# Patient Record
Sex: Female | Born: 2000
Health system: Southern US, Community
[De-identification: ages and names within clinical notes are randomized; demographics above are authoritative.]

## PROBLEM LIST (undated history)

## (undated) DIAGNOSIS — F419 Anxiety disorder, unspecified: Secondary | ICD-10-CM

## (undated) HISTORY — PX: ESOPHAGOGASTRODUODENOSCOPY ENDOSCOPY: SHX5814

## (undated) HISTORY — PX: WISDOM TOOTH EXTRACTION: SHX21

## (undated) HISTORY — DX: Anxiety disorder, unspecified: F41.9

---
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
## 2019-01-06 ENCOUNTER — Ambulatory Visit (INDEPENDENT_AMBULATORY_CARE_PROVIDER_SITE_OTHER): Payer: 59 | Admitting: Psychiatry

## 2019-01-06 ENCOUNTER — Encounter: Payer: Self-pay | Admitting: Psychiatry

## 2019-01-06 ENCOUNTER — Other Ambulatory Visit: Payer: Self-pay

## 2019-01-06 ENCOUNTER — Encounter (INDEPENDENT_AMBULATORY_CARE_PROVIDER_SITE_OTHER): Payer: Self-pay

## 2019-01-06 VITALS — Ht 61.0 in | Wt 113.0 lb

## 2019-01-06 DIAGNOSIS — F410 Panic disorder [episodic paroxysmal anxiety] without agoraphobia: Secondary | ICD-10-CM

## 2019-01-06 DIAGNOSIS — F411 Generalized anxiety disorder: Secondary | ICD-10-CM

## 2019-01-06 MED ORDER — PAROXETINE HCL 10 MG PO TABS: 15.0000 mg | ORAL_TABLET | Freq: Every day | ORAL | 2 refills | Status: DC

## 2019-01-06 NOTE — Progress Notes (Signed)
[facility]  Patient ID: Jenna Macdonald,  MRN: [id number]  PCP: Harrie Jeans, MD  Date of Evaluation: 01/06/2019 Time spent:15 minutes from 1410 to 1425  Chief Complaint:  Chief Complaint    Anxiety; Panic Attack      HISTORY/CURRENT STATUS: Jenna Macdonald is seen onsite in office face-to-face conjointly with father with consent with epic collateral for psychiatric interview and exam in 23-month evaluation and management of generalized and panic anxiety.  Patient and father are doubtful of need for appointments even though they deferred starting Paxil for 3 years patient now stating that treatment has changed her life for the better.  She rarely needs Xanax 0.5 mg once every month or 2.  They required a 17-month refill by phone 11/29/2018 for the Paxil as if doubting the need for appointment or having anxiety specifically for the medical office.  She is no longer taking Prilosec but does take Accutane and OCP.  Father states the patient is thriving and communicates well now, though they do not acknowledge their resistance to treatment in the past or the consequences of such.  However they can look constructively at the future agreeing to yearly follow-up for the Paxil 15 mg every evening after supper.  They closed therapy with Dr. Mikey Bussing.  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  She has no suicidality, hypomania, psychosis, or delirium.   Anxiety        Presents for follow-up visit. Symptoms include decreased concentration, dizziness, excessive worry, feeling of choking, insomnia, nausea, nervous/anxious behavior, panic and restlessness. Patient reports no confusion, no chest pain, no depressed mood, no dizziness, no dry mouth, no palpitations, and no suicidal ideas. Symptoms occur every few days being severe only every month or two. The severity of symptoms is mild to moderate overall. The quality of sleep is good. Nighttime awakenings: Occasional.  Compliance with  medications is 76-100%. Side effects of treatment include GI discomfort.   Individual Medical History/ Review of Systems: Changes? :Yes as 1 month after last session, mother required psychiatric certification to dermatology that patient is a good candidate according to mental health for Accutane, now on her fifth fill with face clear continuing treatment.  She also continues her birth control pill.  Allergies: Patient has no known allergies.  Current Medications:  Current Outpatient Medications:  .  ALPRAZolam (XANAX) 0.5 MG tablet, Take 1 tablet (0.5 mg total) by mouth 2 (two) times daily as needed for anxiety (Panic)., Disp: 60 tablet, Rfl: 1 .  ampicillin (PRINCIPEN) 500 MG capsule, TAKE 1 CAPSULE WITH FOOD ONCE A DAY ORALLY 30 DAYS, Disp: , Rfl:  .  hyoscyamine (LEVSIN SL) 0.125 MG SL tablet, DISSOLVE 1 TABLET ON THE TONGUE 30 MIN BEFORE MEALS, Disp: 30 tablet, Rfl: 0 .  norethindrone-ethinyl estradiol (MICROGESTIN,JUNEL,LOESTRIN) 1-20 MG-MCG tablet, Take 1 tablet by mouth daily., Disp: 3 Package, Rfl: 4 .  omeprazole (PRILOSEC) 40 MG capsule, Take 1 capsule (40 mg total) by mouth daily. Take 40 mg daily before breakfast for 8-10 weeks., Disp: 90 capsule, Rfl: 0 .  PARoxetine (PAXIL) 10 MG tablet, Take 1.5 tablets (15 mg total) by mouth daily., Disp: 135 tablet, Rfl: 2   Medication Side Effects: none  Family Medical/ Social History: Changes? No  MENTAL HEALTH EXAM:  Height 5\' 1"  (1.549 m), weight 113 lb (51.3 kg).Body mass index is 21.35 kg/m.  Others deferred for coronavirus pandemic debate over essentials  General Appearance: Casual, Fairly Groomed and Meticulous  Eye Contact:  Good  Speech:  Clear and Coherent, Normal Rate and Talkative  Volume:  Normal  Mood:  Anxious and Euthymic  Affect:  Inappropriate and Anxious  Thought Process:  Coherent, Goal Directed and Irrelevant  Orientation:  Full (Time, Place, and Person)  Thought Content: Obsessions and Rumination   Suicidal  Thoughts:  No  Homicidal Thoughts:  No  Memory:  Immediate;   Good Remote;   Good  Judgement:  Fair  Insight:  Fair  Psychomotor Activity:  Normal and Mannerisms  Concentration:  Concentration: Good and Attention Span: Good  Recall:  Good  Fund of Knowledge: Good  Language: Good  Assets:  Desire for Improvement Leisure Time Vocational/Educational  ADL's:  Intact  Cognition: WNL  Prognosis:  Good    DIAGNOSES:    ICD-10-CM   1. Generalized anxiety disorder  F41.1 PARoxetine (PAXIL) 10 MG tablet  2. Panic disorder  F41.0 PARoxetine (PAXIL) 10 MG tablet    Receiving Psychotherapy: No    RECOMMENDATIONS: Over 50% of the time is spent in counseling and coordination of care attempting therapeutic understanding and alliance for access to treatment even as they decline return for treatment other than yearly and have closed all other treatments other than birth control pill and Accutane.  Patient cannot with father present explore family components of her anxiety, though she is making excellent progress in applying her skills to her daily life now to be at [employer].  She is E scribed Paxil 10 mg tablet taking 1-1/2 tablets after supper sent as #135 [facility] [address] having sent a 275-month supply there with no refill on 11/29/2018 also.  She will return in early next [place] for follow-up and understands the excellent therapy available at [facility] if willing.   Chauncey MannGlenn E Eilidh Marcano, MD

## 2019-10-05 ENCOUNTER — Other Ambulatory Visit: Payer: Self-pay

## 2019-10-05 DIAGNOSIS — F411 Generalized anxiety disorder: Secondary | ICD-10-CM

## 2019-10-05 DIAGNOSIS — F410 Panic disorder [episodic paroxysmal anxiety] without agoraphobia: Secondary | ICD-10-CM

## 2019-10-05 MED ORDER — PAROXETINE HCL 10 MG PO TABS: 15.0000 mg | ORAL_TABLET | Freq: Every day | ORAL | 0 refills | Status: DC

## 2019-12-15 ENCOUNTER — Ambulatory Visit (INDEPENDENT_AMBULATORY_CARE_PROVIDER_SITE_OTHER): Payer: 59 | Admitting: Psychiatry

## 2019-12-15 ENCOUNTER — Other Ambulatory Visit: Payer: Self-pay

## 2019-12-15 ENCOUNTER — Encounter: Payer: Self-pay | Admitting: Psychiatry

## 2019-12-15 VITALS — Ht 61.0 in | Wt 120.0 lb

## 2019-12-15 DIAGNOSIS — F410 Panic disorder [episodic paroxysmal anxiety] without agoraphobia: Secondary | ICD-10-CM

## 2019-12-15 DIAGNOSIS — F411 Generalized anxiety disorder: Secondary | ICD-10-CM

## 2019-12-15 MED ORDER — PAROXETINE HCL 10 MG PO TABS: 15.0000 mg | ORAL_TABLET | Freq: Every day | ORAL | 3 refills | Status: DC

## 2019-12-15 NOTE — Progress Notes (Signed)
[facility]  Patient ID: Jenna Macdonald,  MRN: [id number]  PCP: Chales Salmon, MD  Date of Evaluation: 12/15/2019 Time spent:15 minutes from 1400 to 1415  Chief Complaint:   HISTORY/CURRENT STATUS: Jenna Macdonald is seen onsite in office 15 minutes face-to-face conjointly with father with consent with epic collateral for psychiatric interview and exam in 85-month evaluation and management of generalized and panic anxiety disorders.  In the last year, the patient has continued her Paxil 15 mg nightly for which she had Prilosec that has been discontinued.  She rarely needs Xanax 0.5 mg twice daily for panic as needed with [place] registry documenting last dispensing 07/08/2018.  She is off Accutane since October 2020 but does continue her birth control pill.  Patient concludes she was been anxious since she was a baby but is doing better in the last year overall with anxiety.  She does have a therapist in [place] not part of the [facility] at [facility] in [place].  Pharmacy is at [facility] close to her apartment so that she has security and consistency complying with treatment.  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  She has no substance use, suicidality, psychosis, delirium, or mania.   Individual Medical History/ Review of Systems: Changes? :Yes She has gained 7 pounds in the last 11 months continuing birth control pill in half Accutane and Prilosec.  Allergies: Patient has no known allergies.  Current Medications:  Current Outpatient Medications:  .  ALPRAZolam (XANAX) 0.5 MG tablet, Take 1 tablet (0.5 mg total) by mouth 2 (two) times daily as needed for anxiety (Panic)., Disp: 60 tablet, Rfl: 1 .  ampicillin (PRINCIPEN) 500 MG capsule, TAKE 1 CAPSULE WITH FOOD ONCE A DAY ORALLY 30 DAYS, Disp: , Rfl:  .  hyoscyamine (LEVSIN SL) 0.125 MG SL tablet, DISSOLVE 1 TABLET ON THE TONGUE 30 MIN BEFORE MEALS,  Disp: 30 tablet, Rfl: 0 .  norethindrone-ethinyl estradiol (MICROGESTIN,JUNEL,LOESTRIN) 1-20 MG-MCG tablet, Take 1 tablet by mouth daily., Disp: 3 Package, Rfl: 4 .  omeprazole (PRILOSEC) 40 MG capsule, Take 1 capsule (40 mg total) by mouth daily. Take 40 mg daily before breakfast for 8-10 weeks., Disp: 90 capsule, Rfl: 0 .  PARoxetine (PAXIL) 10 MG tablet, Take 1.5 tablets (15 mg total) by mouth at bedtime., Disp: 135 tablet, Rfl: 3 Medication Side Effects: none  Family Medical/ Social History: Changes? Yes father does not approve of daily medication but has recently started a statin for his cholesterol, and he can thereby be more accepting of the patient's Paxil.  MENTAL HEALTH EXAM:  Height 5\' 1"  (1.549 m), weight 120 lb (54.4 kg).Body mass index is 22.67 kg/m. Muscle strengths and tone 5/5, postural reflexes and gait 0/0, and AIMS = 0.  General Appearance: Casual, Fairly Groomed and Meticulous  Eye Contact:  Fair  Speech:  Clear and Coherent, Normal Rate and Talkative  Volume:  Normal  Mood:  Anxious and Euphoric  Affect:  Congruent and Anxious  Thought Process:  Coherent, Goal Directed and Descriptions of Associations: Intact  Orientation:  Full (Time, Place, and Person)  Thought Content: Rumination   Suicidal Thoughts:  No  Homicidal Thoughts:  No  Memory:  Immediate;   Good Remote;   Good  Judgement:  Good  Insight:  Fair  Psychomotor Activity:  Normal and Mannerisms  Concentration:  Concentration: Good and Attention Span: Good  Recall:  Good  Fund of  Knowledge: Good  Language: Good  Assets:  Desire for Improvement Intimacy Resilience Talents/Skills  ADL's:  Intact  Cognition: WNL  Prognosis:  Good    DIAGNOSES:    ICD-10-CM   1. Generalized anxiety disorder  F41.1 PARoxetine (PAXIL) 10 MG tablet  2. Panic disorder  F41.0 PARoxetine (PAXIL) 10 MG tablet    Receiving Psychotherapy: Yes  [facility] in [place].    RECOMMENDATIONS: The  patient has adequate supply of Xanax still from last dispensing 07/08/2018 and 0.5 mg twice daily as needed for panic.  She is E scribed Paxil 10 mg tablet taking 1.5 tablets total 15 mg every bedtime sent as #135 with [facility] [place] for generalized and panic anxiety.  Prevention and monitoring safety hygiene are updated and integrated with CBT underway.  She will return for follow-up in 1 year or sooner if needed, mother inquiring about determining duration of treatment and patient may wish to occasion after college complete in 2 years.   Jenna Mann, MD

## 2019-12-16 ENCOUNTER — Encounter: Payer: Self-pay | Admitting: Psychiatry

## 2020-03-14 ENCOUNTER — Encounter: Payer: Self-pay | Admitting: Psychiatry

## 2020-03-15 ENCOUNTER — Other Ambulatory Visit: Payer: Self-pay

## 2020-03-15 DIAGNOSIS — F411 Generalized anxiety disorder: Secondary | ICD-10-CM

## 2020-03-15 DIAGNOSIS — F410 Panic disorder [episodic paroxysmal anxiety] without agoraphobia: Secondary | ICD-10-CM

## 2020-03-15 MED ORDER — PAROXETINE HCL 10 MG PO TABS: 15.0000 mg | ORAL_TABLET | Freq: Every day | ORAL | 3 refills | Status: DC

## 2020-04-27 ENCOUNTER — Telehealth: Payer: Self-pay | Admitting: *Deleted

## 2020-04-27 NOTE — Telephone Encounter (Signed)
Patient called requesting Rx for birth control pills Junel 1.5/30 mcg tablet, last seen in 08/2016. Has annual appointment scheduled on 05/14/20. Patient reports she was getting Rx from dermatologist and she no longer needs to see them. I explained she should 1st reach out to the dermatology office to see if they will supply her with 1 pack until her appointment with [facility] since she has not seen her since 2018. Patient verbalized she will do this and get back with me either way to let me know.

## 2020-05-14 ENCOUNTER — Encounter: Payer: Self-pay | Admitting: Obstetrics & Gynecology

## 2020-05-14 ENCOUNTER — Other Ambulatory Visit: Payer: Self-pay

## 2020-05-14 ENCOUNTER — Ambulatory Visit: Payer: 59 | Admitting: Obstetrics & Gynecology

## 2020-05-14 VITALS — BP 112/70 | Ht 61.25 in | Wt 122.6 lb

## 2020-05-14 DIAGNOSIS — Z01419 Encounter for gynecological examination (general) (routine) without abnormal findings: Secondary | ICD-10-CM | POA: Diagnosis not present

## 2020-05-14 DIAGNOSIS — Z3041 Encounter for surveillance of contraceptive pills: Secondary | ICD-10-CM

## 2020-05-14 MED ORDER — NORETHIN ACE-ETH ESTRAD-FE 1.5-30 MG-MCG PO TABS: 1.0000 | ORAL_TABLET | Freq: Every day | ORAL | 4 refills | Status: DC

## 2020-05-14 NOTE — Progress Notes (Signed)
    Jenna Macdonald [birth year] [id number]   History:    19 y.o. G0 Single.  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  No BTB.  No pelvic pain. STI screen negative about 3-4 yrs ago and not sexually active since then.  Urine/BMs normal.  Breasts normal.  BMI 22.98.   Past medical history,surgical history, family history and social history were all reviewed and documented in the EPIC chart.  Gynecologic History Patient's last menstrual period was 05/07/2020.  Obstetric History OB History  Gravida Para Term Preterm AB Living  0 0 0 0 0 0  SAB IAB Ectopic Multiple Live Births  0 0 0 0 0     ROS: A ROS was performed and pertinent positives and negatives are included in the history.  GENERAL: No fevers or chills. HEENT: No change in vision, no earache, sore throat or sinus congestion. NECK: No pain or stiffness. CARDIOVASCULAR: No chest pain or pressure. No palpitations. PULMONARY: No shortness of breath, cough or wheeze. GASTROINTESTINAL: No abdominal pain, nausea, vomiting or diarrhea, melena or bright red blood per rectum. GENITOURINARY: No urinary frequency, urgency, hesitancy or dysuria. MUSCULOSKELETAL: No joint or muscle pain, no back pain, no recent trauma. DERMATOLOGIC: No rash, no itching, no lesions. ENDOCRINE: No polyuria, polydipsia, no heat or cold intolerance. No recent change in weight. HEMATOLOGICAL: No anemia or easy bruising or bleeding. NEUROLOGIC: No headache, seizures, numbness, tingling or weakness. PSYCHIATRIC: No depression, no loss of interest in normal activity or change in sleep pattern.     Exam:   BP 112/70   Ht 5' 1.25" (1.556 m)   Wt 122 lb 9.6 oz (55.6 kg)   LMP 05/07/2020 Comment: JUNEL  BMI 22.98 kg/m   Body mass index is 22.98 kg/m.  General appearance : Well developed well nourished female. No acute distress HEENT: Eyes: no retinal hemorrhage or exudates,  Neck supple,  trachea midline, no carotid bruits, no thyroidmegaly Lungs: Clear to auscultation, no rhonchi or wheezes, or rib retractions  Heart: Regular rate and rhythm, no murmurs or gallops Breast:Examined in sitting and supine position were symmetrical in appearance, no palpable masses or tenderness,  no skin retraction, no nipple inversion, no nipple discharge, no skin discoloration, no axillary or supraclavicular lymphadenopathy Abdomen: no palpable masses or tenderness, no rebound or guarding Extremities: no edema or skin discoloration or tenderness  Pelvic: Vulva: Normal             Vagina: No gross lesions or discharge  Cervix: No gross lesions or discharge  Uterus  AV, normal size, shape and consistency, non-tender and mobile  Adnexa  Without masses or tenderness  Anus: Normal   Assessment/Plan:  19 y.o. female for annual exam   1. Well female exam with routine gynecological exam Normal gynecologic exam.  Will start Pap tests at age 79, next year.  Abstinent for last 3-4 yrs, had STI screen negative.  No indication to repeat an STI screen.  Breasts normal.  Good BMI at 22.98.  2. Encounter for surveillance of contraceptive pills Well on Junel Fe 1.5/30.  No CI.  Prescription sent to pharmacy.  Other orders - norethindrone-ethinyl estradiol-iron (JUNEL FE 1.5/30) 1.5-30 MG-MCG tablet; Take 1 tablet by mouth daily.  Genia Del MD, 11:36 AM 05/14/2020

## 2020-12-13 ENCOUNTER — Ambulatory Visit (INDEPENDENT_AMBULATORY_CARE_PROVIDER_SITE_OTHER): Payer: 59 | Admitting: Physician Assistant

## 2020-12-13 ENCOUNTER — Ambulatory Visit: Payer: 59 | Admitting: Psychiatry

## 2020-12-13 ENCOUNTER — Encounter: Payer: Self-pay | Admitting: Physician Assistant

## 2020-12-13 ENCOUNTER — Other Ambulatory Visit: Payer: Self-pay

## 2020-12-13 DIAGNOSIS — F411 Generalized anxiety disorder: Secondary | ICD-10-CM

## 2020-12-13 DIAGNOSIS — F410 Panic disorder [episodic paroxysmal anxiety] without agoraphobia: Secondary | ICD-10-CM | POA: Diagnosis not present

## 2020-12-13 MED ORDER — PAROXETINE HCL 10 MG PO TABS: 15.0000 mg | ORAL_TABLET | Freq: Every day | ORAL | 3 refills | Status: DC

## 2020-12-13 MED ORDER — ALPRAZOLAM 0.5 MG PO TABS: 0.5000 mg | ORAL_TABLET | Freq: Two times a day (BID) | ORAL | 1 refills | Status: DC | PRN

## 2020-12-13 NOTE — Progress Notes (Signed)
[facility]  Patient ID: Jenna Macdonald,  MRN: [id number]  PCP: Chales Salmon, MD  Date of Evaluation: 12/13/2020 Time spent:20 minutes  Chief Complaint:  Chief Complaint   Anxiety; Establish Care     HISTORY/CURRENT STATUS: HPI Transferring to my care from Dr. Beverly Milch who retired recently.  Was seen 1 year ago and she has been doing great since then.  She rarely has to take the Xanax but is glad to have it when she needs it.  She has not had a prescription filled since 2020 or possibly before that.  She does not have panic attacks very often now but she does have generalized anxiety, depending on the situation.  The Paxil has been very helpful with that too.  She feels like the medication doses are effective and does not want to change anything.  Patient denies loss of interest in usual activities and is able to enjoy things.  Denies decreased energy or motivation.  Appetite has not changed.  No extreme sadness, tearfulness, or feelings of hopelessness.  Denies any changes in concentration, making decisions or remembering things.  Denies suicidal or homicidal thoughts.  No symptoms of mania, delirium, psychosis.  No reports of calorie restricting, binging or purging.  No self-harm.   Individual Medical History/ Review of Systems: Changes? :No   Past medications for mental health diagnoses include: Paxil, Xanax  Allergies: Patient has no known allergies.  Current Medications:  Current Outpatient Medications:    norethindrone-ethinyl estradiol-iron (JUNEL FE 1.5/30) 1.5-30 MG-MCG tablet, Take 1 tablet by mouth daily., Disp: 84 tablet, Rfl: 4   ALPRAZolam (XANAX) 0.5 MG tablet, Take 1 tablet (0.5 mg total) by mouth 2 (two) times daily as needed for anxiety (Panic)., Disp: 60 tablet, Rfl: 1   PARoxetine (PAXIL) 10 MG tablet, Take 1.5 tablets (15 mg total) by mouth at bedtime., Disp: 135 tablet, Rfl: 3 Medication Side Effects: none  Family Medical/ Social History:  Changes? 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  General Appearance: Casual and Well Groomed  Eye Contact:  Good  Speech:  Clear and Coherent and Normal Rate  Volume:  Normal  Mood:  Euthymic  Affect:  Appropriate  Thought Process:  Goal Directed and Descriptions of Associations: Circumstantial  Orientation:  Full (Time, Place, and Person)  Thought Content: Logical   Suicidal Thoughts:  No  Homicidal Thoughts:  No  Memory:  WNL  Judgement:  Good  Insight:  Good  Psychomotor Activity:  Normal  Concentration:  Concentration: Good  Recall:  Good  Fund of Knowledge: Good  Language: Good  Assets:  Desire for Improvement  ADL's:  Intact  Cognition: WNL  Prognosis:  Good    DIAGNOSES:    ICD-10-CM   1. Generalized anxiety disorder  F41.1 PARoxetine (PAXIL) 10 MG tablet    2. Panic disorder  F41.0 PARoxetine (PAXIL) 10 MG tablet      Receiving Psychotherapy: No    RECOMMENDATIONS:  PDMP reviewed. I provided 20 minutes of face to face time during this encounter, including time spent before and after the visit in records review, medical decision making, and charting.  She is doing really well so no changes need to be made. She and Dr. Marlyne Beards saw each other once a year so I will continue that unless she has problems. Continue Paxil 10 mg,  1.5 pills nightly. Continue Xanax 0.5 mg, 1 p.o. twice daily. Return in 1 year.  Melony Overly, PA-C

## 2021-05-15 ENCOUNTER — Ambulatory Visit: Payer: Self-pay | Admitting: Obstetrics & Gynecology

## 2021-06-03 ENCOUNTER — Other Ambulatory Visit: Payer: Self-pay | Admitting: Obstetrics & Gynecology

## 2021-06-03 NOTE — Telephone Encounter (Signed)
Annual exam scheduled on 06/05/21

## 2021-06-05 ENCOUNTER — Ambulatory Visit: Payer: 59 | Admitting: Obstetrics & Gynecology

## 2021-12-13 ENCOUNTER — Encounter: Payer: Self-pay | Admitting: Physician Assistant

## 2021-12-13 ENCOUNTER — Ambulatory Visit (INDEPENDENT_AMBULATORY_CARE_PROVIDER_SITE_OTHER): Payer: 59 | Admitting: Physician Assistant

## 2021-12-13 DIAGNOSIS — F410 Panic disorder [episodic paroxysmal anxiety] without agoraphobia: Secondary | ICD-10-CM | POA: Diagnosis not present

## 2021-12-13 DIAGNOSIS — F411 Generalized anxiety disorder: Secondary | ICD-10-CM | POA: Diagnosis not present

## 2021-12-13 MED ORDER — PAROXETINE HCL 10 MG PO TABS: 15.0000 mg | ORAL_TABLET | Freq: Every day | ORAL | 3 refills | Status: DC

## 2021-12-13 MED ORDER — ALPRAZOLAM 0.5 MG PO TABS: 0.5000 mg | ORAL_TABLET | Freq: Two times a day (BID) | ORAL | 1 refills | Status: DC | PRN

## 2021-12-13 NOTE — Progress Notes (Signed)
[facility]  Patient ID: Jenna Macdonald,  MRN: [id number]  PCP: Chales Salmon, MD  Date of Evaluation: 12/13/2021 Time spent:20 minutes  Chief Complaint:  Chief Complaint   Anxiety    HISTORY/CURRENT STATUS: HPI  For routine annual med check.  She is accompanied by her mom.  She is doing well.  Paxil is still working well.  She does have some anxiety off and on but it has always triggered by something.  She rarely has panic attacks.  She usually only takes the Xanax when she flies.  The last prescription of 60 pills has lasted a year.  Patient denies loss of interest in usual activities and is able to enjoy things.  Denies decreased energy.  Denies decreased motivation.  She is not working this Joseph.  ADLs and personal hygiene are normal.  Appetite has not changed.  Weight is stable.  No extreme sadness, tearfulness, or feelings of hopelessness.  Sleeps well most of the time.  Denies any changes in concentration, making decisions or remembering things.  No mania, delirium, or psychosis.  Denies suicidal or homicidal thoughts.  Individual Medical History/ Review of Systems: Changes? :No   Past medications for mental health diagnoses include: Paxil, Xanax  Allergies: Patient has no known allergies.  Current Medications:  Current Outpatient Medications:    cholecalciferol (VITAMIN D3) 25 MCG (1000 UNIT) tablet, Take 2,000 Units by mouth daily., Disp: , Rfl:    ALPRAZolam (XANAX) 0.5 MG tablet, Take 1 tablet (0.5 mg total) by mouth 2 (two) times daily as needed for anxiety (Panic)., Disp: 60 tablet, Rfl: 1   norethindrone-ethinyl estradiol-iron (JUNEL FE 1.5/30) 1.5-30 MG-MCG tablet, TAKE 1 TABLET BY MOUTH EVERY DAY (Patient not taking: Reported on 12/13/2021), Disp: 84 tablet, Rfl: 0   PARoxetine (PAXIL) 10 MG tablet, Take 1.5 tablets (15 mg total) by mouth at bedtime., Disp: 135 tablet, Rfl: 3 Medication Side Effects: none  Family Medical/ Social History: Changes?  No  MENTAL HEALTH EXAM:  There were no vitals taken for this visit.There is no height or weight on file to calculate BMI.  General Appearance: Casual and Well Groomed  Eye Contact:  Good  Speech:  Clear and Coherent and Normal Rate  Volume:  Normal  Mood:  Euthymic  Affect:  Appropriate  Thought Process:  Goal Directed and Descriptions of Associations: Circumstantial  Orientation:  Full (Time, Place, and Person)  Thought Content: Logical   Suicidal Thoughts:  No  Homicidal Thoughts:  No  Memory:  WNL  Judgement:  Good  Insight:  Good  Psychomotor Activity:  Normal  Concentration:  Concentration: Good and Attention Span: Good  Recall:  Good  Fund of Knowledge: Good  Language: Good  Assets:  Desire for Improvement  ADL's:  Intact  Cognition: WNL  Prognosis:  Good    DIAGNOSES:    ICD-10-CM   1. Generalized anxiety disorder  F41.1 PARoxetine (PAXIL) 10 MG tablet    2. Panic disorder  F41.0 PARoxetine (PAXIL) 10 MG tablet      Receiving Psychotherapy: No   RECOMMENDATIONS:  PDMP reviewed. Xanax filled 12/13/2020. I provided 20 minutes of face to face time during this encounter, including time spent before and after the visit in records review, medical decision making, counseling pertinent to today's visit, and charting.   She is doing well so no changes need to be made.  Continue Paxil 10 mg, 1.5 pills nightly. Continue Xanax 0.5 mg, 1 p.o. twice daily. Return in 1 year.  Donnal Moat, PA-C

## 2022-11-25 ENCOUNTER — Other Ambulatory Visit: Payer: Self-pay | Admitting: Physician Assistant

## 2022-11-25 DIAGNOSIS — F411 Generalized anxiety disorder: Secondary | ICD-10-CM

## 2022-11-25 DIAGNOSIS — F410 Panic disorder [episodic paroxysmal anxiety] without agoraphobia: Secondary | ICD-10-CM

## 2022-12-15 ENCOUNTER — Encounter: Payer: Self-pay | Admitting: Physician Assistant

## 2022-12-15 ENCOUNTER — Ambulatory Visit: Payer: 59 | Admitting: Physician Assistant

## 2022-12-15 DIAGNOSIS — F411 Generalized anxiety disorder: Secondary | ICD-10-CM | POA: Diagnosis not present

## 2022-12-15 DIAGNOSIS — F410 Panic disorder [episodic paroxysmal anxiety] without agoraphobia: Secondary | ICD-10-CM

## 2022-12-15 MED ORDER — ALPRAZOLAM 0.5 MG PO TABS
0.5000 mg | ORAL_TABLET | Freq: Two times a day (BID) | ORAL | 1 refills | Status: AC | PRN
Start: 2022-12-15 — End: ?

## 2022-12-15 NOTE — Progress Notes (Signed)
[facility]  Patient ID: Jenna Macdonald,  MRN: [id number]  PCP: Chales Salmon, MD (Inactive)  Date of Evaluation: 12/15/2022 Time spent:20 minutes  Chief Complaint:  Chief Complaint   Anxiety; Depression    HISTORY/CURRENT STATUS: HPI  For routine annual med check.   Graduated from [employer] in [employer], will start at [employer] in grad school next month.   Patient is able to enjoy things.  Energy and motivation are good.  No extreme sadness, tearfulness, or feelings of hopelessness.  Sleeps well most of the time. ADLs and personal hygiene are normal.   Denies any changes in concentration, making decisions, or remembering things.  Appetite has not changed.  Weight is stable.  Denies laxative use, calorie restricting, or binging and purging.   Denies cutting or any form of self-harm.  Anxiety is well controlled with current treatment.  No panic attacks.  She does take the Xanax occasionally and it is still effective.  Denies suicidal or homicidal thoughts.  Patient denies increased energy with decreased need for sleep, increased talkativeness, racing thoughts, impulsivity or risky behaviors, increased spending, increased libido, grandiosity, increased irritability or anger, paranoia, or hallucinations.  Denies dizziness, syncope, seizures, numbness, tingling, tremor, tics, unsteady gait, slurred speech, confusion. Denies muscle or joint pain, stiffness, or dystonia.  Individual Medical History/ Review of Systems: Changes? :No   Past medications for mental health diagnoses include: Paxil, Xanax  Allergies: Patient has no known allergies.  Current Medications:  Current Outpatient Medications:    cholecalciferol (VITAMIN D3) 25 MCG (1000 UNIT) tablet, Take 2,000 Units by mouth daily., Disp: , Rfl:    norethindrone-ethinyl estradiol-iron (JUNEL FE 1.5/30) 1.5-30 MG-MCG tablet, TAKE 1 TABLET BY MOUTH EVERY DAY, Disp: 84 tablet, Rfl: 0   PARoxetine (PAXIL) 10 MG tablet, TAKE 1 AND  1/2 TABLETS (15 MG TOTAL) BY MOUTH EVERY DAY AT BEDTIME, Disp: 135 tablet, Rfl: 3   ALPRAZolam (XANAX) 0.5 MG tablet, Take 1 tablet (0.5 mg total) by mouth 2 (two) times daily as needed for anxiety (Panic)., Disp: 60 tablet, Rfl: 1 Medication Side Effects: none  Family Medical/ Social History: Changes? No  MENTAL HEALTH EXAM:  There were no vitals taken for this visit.There is no height or weight on file to calculate BMI.  General Appearance: Casual and Well Groomed  Eye Contact:  Good  Speech:  Clear and Coherent and Normal Rate  Volume:  Normal  Mood:  Euthymic  Affect:  Appropriate  Thought Process:  Goal Directed and Descriptions of Associations: Circumstantial  Orientation:  Full (Time, Place, and Person)  Thought Content: Logical   Suicidal Thoughts:  No  Homicidal Thoughts:  No  Memory:  WNL  Judgement:  Good  Insight:  Good  Psychomotor Activity:  Normal  Concentration:  Concentration: Good and Attention Span: Good  Recall:  Good  Fund of Knowledge: Good  Language: Good  Assets:  Desire for Improvement Financial Resources/Insurance Housing Transportation Vocational/Educational  ADL's:  Intact  Cognition: WNL  Prognosis:  Good   DIAGNOSES:    ICD-10-CM   1. Generalized anxiety disorder  F41.1     2. Panic disorder  F41.0      Receiving Psychotherapy: No   RECOMMENDATIONS:  PDMP reviewed. Xanax filled 12/13/2021. I provided 20 minutes of face to face time during this encounter, including time spent before and after the visit in records review, medical decision making, counseling pertinent to today's visit, and charting.   She is doing well so no changes need  to be made.  Continue Xanax 0.5 mg, 1 p.o. twice daily. Continue Paxil 10 mg, 1.5 pills nightly. Return in 1 year.  Melony Overly, PA-C

## 2023-12-10 ENCOUNTER — Other Ambulatory Visit: Payer: Self-pay | Admitting: Physician Assistant

## 2023-12-10 DIAGNOSIS — F410 Panic disorder [episodic paroxysmal anxiety] without agoraphobia: Secondary | ICD-10-CM

## 2023-12-10 DIAGNOSIS — F411 Generalized anxiety disorder: Secondary | ICD-10-CM

## 2023-12-11 ENCOUNTER — Encounter: Payer: Self-pay | Admitting: Advanced Practice Midwife

## 2023-12-16 ENCOUNTER — Ambulatory Visit: Payer: 59 | Admitting: Physician Assistant

## 2023-12-21 ENCOUNTER — Ambulatory Visit: Admitting: Physician Assistant

## 2023-12-21 ENCOUNTER — Encounter: Payer: Self-pay | Admitting: Physician Assistant

## 2023-12-21 DIAGNOSIS — F411 Generalized anxiety disorder: Secondary | ICD-10-CM | POA: Diagnosis not present

## 2023-12-21 DIAGNOSIS — F410 Panic disorder [episodic paroxysmal anxiety] without agoraphobia: Secondary | ICD-10-CM | POA: Diagnosis not present

## 2023-12-21 NOTE — Progress Notes (Unsigned)
 [facility]  Patient ID: Jenna Macdonald,  MRN: [id number]  PCP: Sybil Hoose, MD  Date of Evaluation: 12/21/2023 Time spent:20 minutes  Chief Complaint:  Chief Complaint   Anxiety; Follow-up   Virtual Visit via Telehealth  I connected with patient by telephone, with their informed consent, and verified patient privacy and that I am speaking with the correct person using two identifiers.  I am private, in my office and the patient is at home.  I discussed the limitations, risks, security and privacy concerns of performing an evaluation and management service by telephone and the availability of in person appointments. I also discussed with the patient that there may be a patient responsible charge related to this service. The patient expressed understanding and agreed to proceed.   I discussed the assessment and treatment plan with the patient. The patient was provided an opportunity to ask questions and all were answered. The patient agreed with the plan and demonstrated an understanding of the instructions.   The patient was advised to call back or seek an in-person evaluation if the symptoms worsen or if the condition fails to improve as anticipated.  I provided approximately 25 minutes of non-face-to-face time during this encounter.  HISTORY/CURRENT STATUS: HPI  For routine annual med check.   Doing well w/ current meds. Anxiety is controlled and rarely needs the Xanax .  Paxil  still works great.  Only occas PA.   Patient is able to enjoy things.  Energy and motivation are good.  Work is going well.   No extreme sadness, tearfulness, or feelings of hopelessness.  Sleeps ok. ADLs and personal hygiene are normal.   Denies any changes in concentration, making decisions, or remembering things.  Appetite has not changed.  Weight is stable.  No mania, delirium, AH/VH.  No SI/HI.  Denies dizziness, syncope, seizures, numbness, tingling, tremor, tics, unsteady gait, slurred  speech, confusion. Denies muscle or joint pain, stiffness, or dystonia.  Individual Medical History/ Review of Systems: Changes? :Yes   dx w/ melanoma on little toe St 0. Kidney stone.  Past medications for mental health diagnoses include: Paxil , Xanax   Allergies: Flomax [tamsulosin]  Current Medications:  Current Outpatient Medications:    ALPRAZolam  (XANAX ) 0.5 MG tablet, Take 1 tablet (0.5 mg total) by mouth 2 (two) times daily as needed for anxiety (Panic)., Disp: 60 tablet, Rfl: 1   cholecalciferol (VITAMIN D3) 25 MCG (1000 UNIT) tablet, Take 2,000 Units by mouth daily., Disp: , Rfl:    norethindrone -ethinyl estradiol-iron (JUNEL FE 1.5/30) 1.5-30 MG-MCG tablet, TAKE 1 TABLET BY MOUTH EVERY DAY, Disp: 84 tablet, Rfl: 0   PARoxetine  (PAXIL ) 10 MG tablet, TAKE 1 AND 1/2 TABLETS (15 MG TOTAL) BY MOUTH EVERY DAY AT BEDTIME, Disp: 135 tablet, Rfl: 1 Medication Side Effects: none  Family Medical/ Social History: Changes? No  MENTAL HEALTH EXAM:  There were no vitals taken for this visit.There is no height or weight on file to calculate BMI.  General Appearance: Unable to assess  Eye Contact:  Unable to assess  Speech:  Clear and Coherent and Normal Rate  Volume:  Normal  Mood:  Euthymic  Affect:  Unable to assess  Thought Process:  Goal Directed and Descriptions of Associations: Circumstantial  Orientation:  Full (Time, Place, and Person)  Thought Content: Logical   Suicidal Thoughts:  No  Homicidal Thoughts:  No  Memory:  WNL  Judgement:  Good  Insight:  Good  Psychomotor Activity:  Unable to assess  Concentration:  Concentration:  Good and Attention Span: Good  Recall:  Good  Fund of Knowledge: Good  Language: Good  Assets:  Desire for Improvement Financial Resources/Insurance Housing Transportation Vocational/Educational  ADL's:  Intact  Cognition: WNL  Prognosis:  Good   DIAGNOSES:    ICD-10-CM   1. Generalized anxiety disorder  F41.1     2. Panic disorder   F41.0      Receiving Psychotherapy: No   RECOMMENDATIONS:  PDMP reviewed.  No controlled substances listed. I provided approximately 25 minutes of non-face-to-face time during this encounter, including time spent before and after the visit in records review, medical decision making, counseling pertinent to today's visit, and charting.   I am glad to see her doing so well.  No changes in treatment are needed.  Continue Xanax  0.5 mg, 1 p.o. twice daily. Continue Paxil  10 mg, 1.5 pills nightly. Return in 1 year.  Verneita Cooks, PA-C

## 2024-03-24 ENCOUNTER — Ambulatory Visit (INDEPENDENT_AMBULATORY_CARE_PROVIDER_SITE_OTHER): Payer: Self-pay | Admitting: Physician Assistant

## 2024-03-24 ENCOUNTER — Encounter: Payer: Self-pay | Admitting: Physician Assistant

## 2024-03-24 DIAGNOSIS — F410 Panic disorder [episodic paroxysmal anxiety] without agoraphobia: Secondary | ICD-10-CM

## 2024-03-24 DIAGNOSIS — F411 Generalized anxiety disorder: Secondary | ICD-10-CM

## 2024-03-24 NOTE — Progress Notes (Signed)
 [facility]  Patient ID: Jenna Macdonald,  MRN: [id number]  PCP: Sybil Hoose, MD  Date of Evaluation: 03/24/2024 Time spent:25 minutes  Chief Complaint:  Chief Complaint   Anxiety    Virtual Visit via Telehealth  I connected with patient by telephone, with their informed consent, and verified patient privacy and that I am speaking with the correct person using two identifiers.  I am private, in my office and the patient is at home.  I discussed the limitations, risks, security and privacy concerns of performing an evaluation and management service by telephone and the availability of in person appointments. I also discussed with the patient that there may be a patient responsible charge related to this service. The patient expressed understanding and agreed to proceed.   I discussed the assessment and treatment plan with the patient. The patient was provided an opportunity to ask questions and all were answered. The patient agreed with the plan and demonstrated an understanding of the instructions.   The patient was advised to call back or seek an in-person evaluation if the symptoms worsen or if the condition fails to improve as anticipated.  I provided approximately 25 minutes of non-face-to-face time during this encounter.  HISTORY/CURRENT STATUS: HPI  For routine annual med check.   Wants to discuss a new dx of colitis.  She was told by the gastroenterologist that either the Paxil  and/or the birth control pills could be contributing to the inflammation in the colon and recommended she go to 1 or both.  She is sexually active and apparently any hormonal birth control.  Contribute to the inflammation.  She asks about going off Paxil .  She has been on Paxil  since approximately 2019.  It was stressful going to college and she was started on it then.  Has more anxiety than any depression.  She takes Xanax  on very rare occasions, a quantity of 60 has lasted over a year.  She  does not feel anxious now, at least most of the time.  No panic attacks.  Patient is able to enjoy things.  Energy and motivation are good.  Work is going well.   No extreme sadness, tearfulness, or feelings of hopelessness.  Sleeps well most of the time. ADLs and personal hygiene are normal.   Denies any changes in concentration, making decisions, or remembering things.  Appetite has not changed.  No mania, delirium, AH/VH.  No SI/HI.  Individual Medical History/ Review of Systems: Changes? :Yes   dx with (microscopic) lymphocytic colitis by colonoscopy.   Past medications for mental health diagnoses include: Paxil , Xanax   Allergies: Flomax [tamsulosin]  Current Medications:  Current Outpatient Medications:    ALPRAZolam  (XANAX ) 0.5 MG tablet, Take 1 tablet (0.5 mg total) by mouth 2 (two) times daily as needed for anxiety (Panic)., Disp: 60 tablet, Rfl: 1   Bacillus Coagulans-Inulin (PROBIOTIC-PREBIOTIC PO), Take by mouth., Disp: , Rfl:    norethindrone -ethinyl estradiol-iron (JUNEL FE 1.5/30) 1.5-30 MG-MCG tablet, TAKE 1 TABLET BY MOUTH EVERY DAY, Disp: 84 tablet, Rfl: 0   PARoxetine  (PAXIL ) 10 MG tablet, TAKE 1 AND 1/2 TABLETS (15 MG TOTAL) BY MOUTH EVERY DAY AT BEDTIME, Disp: 135 tablet, Rfl: 1 Medication Side Effects: none  Family Medical/ Social History: Changes? No  MENTAL HEALTH EXAM:  There were no vitals taken for this visit.There is no height or weight on file to calculate BMI.  General Appearance: Unable to assess  Eye Contact:  Unable to assess  Speech:  Clear and Coherent  and Normal Rate  Volume:  Normal  Mood:  Euthymic  Affect:  Unable to assess  Thought Process:  Goal Directed and Descriptions of Associations: Circumstantial  Orientation:  Full (Time, Place, and Person)  Thought Content: Logical   Suicidal Thoughts:  No  Homicidal Thoughts:  No  Memory:  WNL  Judgement:  Good  Insight:  Good  Psychomotor Activity:  Unable to assess  Concentration:  Concentration:  Good and Attention Span: Good  Recall:  Good  Fund of Knowledge: Good  Language: Good  Assets:  Communication Skills Desire for Improvement Financial Resources/Insurance Housing Transportation Vocational/Educational  ADL's:  Intact  Cognition: WNL  Prognosis:  Good   DIAGNOSES:    ICD-10-CM   1. Generalized anxiety disorder  F41.1     2. Panic disorder  F41.0      Receiving Psychotherapy: No   RECOMMENDATIONS:  PDMP reviewed.  A few oxycodone given 01/12/2024. I provided approximately  25 minutes of non-face-to-face time during this encounter, including time spent before and after the visit in records review, medical decision making, counseling pertinent to today's visit, and charting.   We discussed weaning off the Paxil .  I think that would be a good place to start since we know she needs the contraceptive for now.  She will decrease the Paxil  as follows: 12.5 mg for a week, 10 mg, 1 week, 7.5 mg daily for 1 week, 5 mg daily for 1 week, 2.5 mg daily for 1 week and then stop.  Continue Xanax  0.5 mg, 1 p.o. twice daily. Return in 2 months.   Verneita Cooks, PA-C

## 2024-04-20 ENCOUNTER — Encounter: Payer: Self-pay | Admitting: Physician Assistant

## 2024-04-20 ENCOUNTER — Ambulatory Visit: Admitting: Physician Assistant

## 2024-04-20 DIAGNOSIS — F411 Generalized anxiety disorder: Secondary | ICD-10-CM

## 2024-04-20 DIAGNOSIS — F410 Panic disorder [episodic paroxysmal anxiety] without agoraphobia: Secondary | ICD-10-CM

## 2024-04-20 NOTE — Progress Notes (Signed)
 [facility]  Patient ID: Jenna Macdonald,  MRN: [id number]  PCP: Jenna Hoose, MD  Date of Evaluation: 04/20/2024 Time spent:20 minutes  Chief Complaint:  Chief Complaint   Follow-up    Virtual Visit via Telehealth  I connected with patient by telephone, with their informed consent, and verified patient privacy and that I am speaking with the correct person using two identifiers.  I am private, in my office and the patient is at home.  I discussed the limitations, risks, security and privacy concerns of performing an evaluation and management service by telephone and the availability of in person appointments. I also discussed with the patient that there may be a patient responsible charge related to this service. The patient expressed understanding and agreed to proceed.   I discussed the assessment and treatment plan with the patient. The patient was provided an opportunity to ask questions and all were answered. The patient agreed with the plan and demonstrated an understanding of the instructions.   The patient was advised to call back or seek an in-person evaluation if the symptoms worsen or if the condition fails to improve as anticipated.  I provided approximately 20 minutes of non-face-to-face time during this encounter.  HISTORY/CURRENT STATUS: HPI  For 6-week med check.  At our last visit we i discussed weaning completely off Paxil .  She had been diagnosed with colitis (see previous note) that could have been caused or aggravated by the Paxil .  Jenna Macdonald got down to 10 mg but was feeling dizzy so she stopped at that dose.  She feels like the dizziness could have been caused by the steroids.  She stopped those last week and has felt better.  She has stayed on Paxil  10 mg.  She feels more comfortable staying on it at least for a little while, she is back in school which is stressful.  And with the holidays she prefers to keep things as is.  Her mood is good.  She is able  to enjoy things.  Energy and motivation are good.  Appetite is normal.  ADLs and personal hygiene are normal.  Not experiencing much anxiety at all.  She has Xanax  on hand but has not taken it in a while.  No panic attacks.  No manic symptoms.  No delirium or psychosis.  No suicidal or homicidal thoughts.  Individual Medical History/ Review of Systems: Changes? :No     Past medications for mental health diagnoses include: Paxil , Xanax   Allergies: Flomax [tamsulosin]  Current Medications:  Current Outpatient Medications:    ALPRAZolam  (XANAX ) 0.5 MG tablet, Take 1 tablet (0.5 mg total) by mouth 2 (two) times daily as needed for anxiety (Panic)., Disp: 60 tablet, Rfl: 1   Bacillus Coagulans-Inulin (PROBIOTIC-PREBIOTIC PO), Take by mouth., Disp: , Rfl:    norethindrone -ethinyl estradiol-iron (JUNEL FE 1.5/30) 1.5-30 MG-MCG tablet, TAKE 1 TABLET BY MOUTH EVERY DAY, Disp: 84 tablet, Rfl: 0   PARoxetine  (PAXIL ) 10 MG tablet, TAKE 1 AND 1/2 TABLETS (15 MG TOTAL) BY MOUTH EVERY DAY AT BEDTIME (Patient taking differently: Take 10 mg by mouth daily.), Disp: 135 tablet, Rfl: 1 Medication Side Effects: none  Family Medical/ Social History: Changes? No  MENTAL HEALTH EXAM:  There were no vitals taken for this visit.There is no height or weight on file to calculate BMI.  General Appearance: Unable to assess  Eye Contact:  Unable to assess  Speech:  Clear and Coherent and Normal Rate  Volume:  Normal  Mood:  Euthymic  Affect:  Unable to assess  Thought Process:  Goal Directed and Descriptions of Associations: Circumstantial  Orientation:  Full (Time, Place, and Person)  Thought Content: Logical   Suicidal Thoughts:  No  Homicidal Thoughts:  No  Memory:  WNL  Judgement:  Good  Insight:  Good  Psychomotor Activity:  Unable to assess  Concentration:  Concentration: Good and Attention Span: Good  Recall:  Good  Fund of Knowledge: Good  Language: Good  Assets:  Communication Skills Desire for  Improvement Financial Resources/Insurance Housing Resilience Transportation Vocational/Educational  ADL's:  Intact  Cognition: WNL  Prognosis:  Good   DIAGNOSES:    ICD-10-CM   1. Generalized anxiety disorder  F41.1     2. Panic disorder  F41.0       Receiving Psychotherapy: No   RECOMMENDATIONS:  PDMP reviewed.  A few oxycodone given 01/12/2024. I provided approximately  20 minutes of non-face-to-face time during this encounter, including time spent before and after the visit in records review, medical decision making, counseling pertinent to today's visit, and charting.   We discussed the Paxil .  She is stable at the current dose so we decided to make no changes at this time.  After her finals and/or the holidays, she can wean off if she prefers but if not staying at 10 mg is fine.    Continue Xanax  0.5 mg, 1 p.o. twice daily. Continue Paxil  10 mg daily. Return 6 weeks after she completely stops the Paxil  (or in 1 year if she continues it.)  Jenna Cooks, PA-C
